# Patient Record
Sex: Female | Born: 2000
Health system: Southern US, Community
[De-identification: ages and names within clinical notes are randomized; demographics above are authoritative.]

## PROBLEM LIST (undated history)

## (undated) DIAGNOSIS — G43909 Migraine, unspecified, not intractable, without status migrainosus: Secondary | ICD-10-CM

## (undated) DIAGNOSIS — M0880 Other juvenile arthritis, unspecified site: Secondary | ICD-10-CM

## (undated) DIAGNOSIS — N926 Irregular menstruation, unspecified: Secondary | ICD-10-CM

## (undated) DIAGNOSIS — N83209 Unspecified ovarian cyst, unspecified side: Secondary | ICD-10-CM

## (undated) DIAGNOSIS — G43009 Migraine without aura, not intractable, without status migrainosus: Secondary | ICD-10-CM

## (undated) HISTORY — DX: Irregular menstruation, unspecified: N92.6

## (undated) HISTORY — DX: Other juvenile arthritis, unspecified site: M08.80

## (undated) HISTORY — DX: Unspecified ovarian cyst, unspecified side: N83.209

## (undated) HISTORY — DX: Migraine, unspecified, not intractable, without status migrainosus: G43.909

## (undated) HISTORY — PX: HIP ARTHROSCOPY W/ LABRAL REPAIR: SHX1750

## (undated) HISTORY — PX: WISDOM TOOTH EXTRACTION: SHX21

## (undated) HISTORY — DX: Migraine without aura, not intractable, without status migrainosus: G43.009

---
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
## 2022-03-08 ENCOUNTER — Ambulatory Visit (INDEPENDENT_AMBULATORY_CARE_PROVIDER_SITE_OTHER): Payer: 59 | Admitting: Adult Health

## 2022-03-08 ENCOUNTER — Other Ambulatory Visit: Payer: Self-pay

## 2022-03-08 ENCOUNTER — Encounter: Payer: Self-pay | Admitting: Adult Health

## 2022-03-08 VITALS — BP 114/68 | HR 104 | Temp 99.4°F | Ht 67.0 in | Wt 150.0 lb

## 2022-03-08 DIAGNOSIS — R002 Palpitations: Secondary | ICD-10-CM | POA: Diagnosis not present

## 2022-03-08 DIAGNOSIS — F419 Anxiety disorder, unspecified: Secondary | ICD-10-CM

## 2022-03-08 NOTE — Progress Notes (Signed)
[facility] [address] [place], [place] [address] Phone: [phone] Fax: [phone]   Office Visit Note  Patient Name: Denise Sheppard  Date of [id number]  Med Rec number [id number]  Date of Service: 03/08/2022  Bee venom, Amoxicillin-pot clavulanate, Delsym [dextromethorphan polistirex er], Dextromethorphan-guaifenesin, and Guaifenesin & derivatives  Chief Complaint  Patient presents with   Chest Pain    Palpitations at night      Chest Pain  Associated symptoms include palpitations. Pertinent negatives include no dizziness, fever, headaches, numbness or weakness.    Patient reports when she lays down at night to go to sleep, she feels like her heart is "pausing".  She also reports feeling like she needs to gasp for breath at times while laying down. She is in a single room this year.  She has noticed it a few times while sitting in class.  She describes it as happening once or twice, and then its normal.  She denies any dizziness, headache or nausea during episodes.   Current Medication:  Outpatient Encounter Medications as of 03/08/2022  Medication Sig   amitriptyline (ELAVIL) 10 MG tablet Take 37.5 mg by mouth at bedtime.   cetirizine HCl (ZYRTEC) 5 MG/5ML SYRP Take 5 mg by mouth daily.   magnesium citrate SOLN Take by mouth.   Pediatric Multiple Vit-C-FA (CHILDRENS CHEWABLE MULTI VITS PO) Take 1 tablet by mouth daily.   rizatriptan (MAXALT) 10 MG tablet Take 10 mg by mouth 2 (two) times daily as needed.   Ferrous Sulfate (IRON) 325 (65 Fe) MG TABS Take 1 capsule by mouth every 3 (three) days. (Patient not taking: Reported on 12/20/2020)   norgestimate-ethinyl estradiol (ORTHO-CYCLEN) 0.25-35 MG-MCG tablet Take 1 tablet by mouth daily. (Patient not taking: Reported on 03/08/2022)   Riboflavin 100 MG CAPS Take by mouth. (Patient not taking: Reported on 03/08/2022)   No facility-administered encounter medications on file as of 03/08/2022.      Medical  History: Past Medical History:  Diagnosis Date   Irregular periods    JIA (juvenile idiopathic arthritis) (HCC)    Migraine      Vital Signs: BP 114/68 (BP Location: Right Arm, Patient Position: Sitting)   Pulse (!) 104   Temp 99.4 F (37.4 C) (Tympanic)   Ht 5\' 7"  (1.702 m)   Wt 150 lb (68 kg)   SpO2 98%   BMI 23.49 kg/m    Review of Systems  Constitutional:  Negative for fatigue and fever.  HENT:  Negative for sinus pressure.   Cardiovascular:  Positive for chest pain and palpitations.  Neurological:  Negative for dizziness, syncope, weakness, light-headedness, numbness and headaches.    Physical Exam Constitutional:      Appearance: She is well-developed.  Cardiovascular:     Rate and Rhythm: Normal rate and regular rhythm.     Heart sounds: Normal heart sounds. Heart sounds not distant. No murmur heard. Neurological:     Mental Status: She is alert.  Psychiatric:        Mood and Affect: Mood normal. Mood is not anxious.        Behavior: Behavior normal.    Assessment/Plan: 1. Intermittent palpitations Episodes do not appear to be getting worse.  Discussed following up in clinic or with PCP for possible Holter monitor if symptoms worsen or continue. Encouraged good nutrition, and hydration.   2. Anxiety Likely anxiety related to school adjustment.  She is local from [place], and can see her PCP.  Discussed medication  like Hydroxyzine at bedtime, patient declined at this time and would like to wait and see if symptoms resolve as her comfort level being back at school improves. I agree with this plan, and encouraged patient to go ahead and make an appt with her PCP, that she could cancel if symptoms resolve.  She is agreeable.      General Counseling: eilidh marcano understanding of the findings of todays visit and agrees with plan of treatment. I have discussed any further diagnostic evaluation that may be needed or ordered today. We also reviewed her  medications today. she has been encouraged to call the office with any questions or concerns that should arise related to todays visit.   No orders of the defined types were placed in this encounter.   No orders of the defined types were placed in this encounter.   Time spent:20 Minutes including chart review.    Johnna Acosta AGNP-C Nurse Practitioner

## 2022-06-25 ENCOUNTER — Ambulatory Visit
Admission: EM | Admit: 2022-06-25 | Discharge: 2022-06-25 | Disposition: A | Discharge status: Home / Self Care | Payer: 59 | Attending: Emergency Medicine | Admitting: Emergency Medicine

## 2022-06-25 DIAGNOSIS — B349 Viral infection, unspecified: Secondary | ICD-10-CM | POA: Diagnosis not present

## 2022-06-25 DIAGNOSIS — Z1152 Encounter for screening for COVID-19: Secondary | ICD-10-CM | POA: Diagnosis not present

## 2022-06-25 LAB — RESP PANEL BY RT-PCR (FLU A&B, COVID) ARPGX2
Influenza A by PCR: NEGATIVE
Influenza B by PCR: NEGATIVE
SARS Coronavirus 2 by RT PCR: NEGATIVE

## 2022-06-25 NOTE — ED Provider Notes (Signed)
Denise Sheppard    CSN: [id number] Arrival date & time: 06/25/22  0901      History   Chief Complaint Chief Complaint  Patient presents with   Cough   Sore Throat    HPI Denise Sheppard is a 21 y.o. female.  Patient presents with congestion, cough, ear pain, headache since yesterday.  Negative at-home COVID test yesterday.  She denies fever, rash, sore throat, shortness of breath, vomiting, diarrhea, or other symptoms.  No OTC medications taken today.  Her medical history includes migraine headaches.  The history is provided by the patient and medical records.    Past Medical History:  Diagnosis Date   Irregular periods    JIA (juvenile idiopathic arthritis) (HCC)    Migraine     Patient Active Problem List   Diagnosis Date Noted   Apophysitis of fifth metatarsal, left 06/13/2016   Strain of calf muscle, initial encounter 05/03/2016    Past Surgical History:  Procedure Laterality Date   HIP ARTHROSCOPY W/ LABRAL REPAIR      OB History     Gravida  0   Para  0   Term  0   Preterm  0   AB  0   Living  0      SAB  0   IAB  0   Ectopic  0   Multiple  0   Live Births  0            Home Medications    Prior to Admission medications   Medication Sig Start Date End Date Taking? Authorizing Provider  amitriptyline (ELAVIL) 10 MG tablet Take 37.5 mg by mouth at bedtime. 09/30/19  Yes [provider]  cetirizine HCl (ZYRTEC) 5 MG/5ML SYRP Take 5 mg by mouth daily.   Yes [provider]  magnesium citrate SOLN Take by mouth.   Yes [provider]  Pediatric Multiple Vit-C-FA (CHILDRENS CHEWABLE MULTI VITS PO) Take 1 tablet by mouth daily.   Yes [provider]  Riboflavin 100 MG CAPS Take by mouth.   Yes [provider]  rizatriptan (MAXALT) 10 MG tablet Take 10 mg by mouth 2 (two) times daily as needed. 06/30/19  Yes [provider]  Ferrous Sulfate (IRON) 325 (65 Fe) MG TABS Take 1  capsule by mouth every 3 (three) days. Patient not taking: Reported on 12/20/2020    [provider]  norgestimate-ethinyl estradiol (ORTHO-CYCLEN) 0.25-35 MG-MCG tablet Take 1 tablet by mouth daily. Patient not taking: Reported on 03/08/2022 12/20/20   Doreene Burke, CNM    Family History Family History  Problem Relation Age of Onset   Ovarian cysts Mother    Cancer Maternal Grandmother    Diabetes Maternal Grandmother    Diabetes Maternal Grandfather    Stroke Paternal Grandmother    Diabetes Paternal Grandfather     Social History Social History   Tobacco Use   Smoking status: Never   Smokeless tobacco: Never  Vaping Use   Vaping Use: Never used  Substance Use Topics   Alcohol use: No   Drug use: No     Allergies   Bee venom, Amoxicillin-pot clavulanate, Delsym [dextromethorphan polistirex er], Dextromethorphan-guaifenesin, and Guaifenesin & derivatives   Review of Systems Review of Systems  Constitutional:  Negative for chills and fever.  HENT:  Positive for congestion, ear pain and sore throat.   Respiratory:  Positive for cough. Negative for shortness of breath.   Cardiovascular:  Negative for  chest pain and palpitations.  Gastrointestinal:  Negative for abdominal pain, diarrhea and vomiting.  Skin:  Negative for color change and rash.  Neurological:  Positive for headaches.  All other systems reviewed and are negative.    Physical Exam Triage Vital Signs ED Triage Vitals  Enc Vitals Group     BP 06/25/22 1002 119/76     Pulse Rate 06/25/22 1002 89     Resp 06/25/22 1002 16     Temp 06/25/22 1002 98.6 F (37 C)     Temp Source 06/25/22 1002 Oral     SpO2 06/25/22 1002 98 %     Weight --      Height --      Head Circumference --      Peak Flow --      Pain Score 06/25/22 0958 0     Pain Loc --      Pain Edu? --      Excl. in GC? --    No data found.  Updated Vital Signs BP 119/76 (BP Location: Right Arm)   Pulse 89   Temp 98.6 F (37  C) (Oral)   Resp 16   LMP 06/03/2022 (Exact Date)   SpO2 98%   Visual Acuity Right Eye Distance:   Left Eye Distance:   Bilateral Distance:    Right Eye Near:   Left Eye Near:    Bilateral Near:     Physical Exam Vitals and nursing note reviewed.  Constitutional:      General: She is not in acute distress.    Appearance: Normal appearance. She is well-developed. She is not ill-appearing.  HENT:     Right Ear: Tympanic membrane normal.     Left Ear: Tympanic membrane normal.     Nose: Nose normal.     Mouth/Throat:     Mouth: Mucous membranes are moist.     Pharynx: Oropharynx is clear.  Cardiovascular:     Rate and Rhythm: Normal rate and regular rhythm.     Heart sounds: Normal heart sounds.  Pulmonary:     Effort: Pulmonary effort is normal. No respiratory distress.     Breath sounds: Normal breath sounds.  Musculoskeletal:     Cervical back: Neck supple.  Skin:    General: Skin is warm and dry.  Neurological:     Mental Status: She is alert.  Psychiatric:        Mood and Affect: Mood normal.        Behavior: Behavior normal.      UC Treatments / Results  Labs (all labs ordered are listed, but only abnormal results are displayed) Labs Reviewed  RESP PANEL BY RT-PCR (FLU A&B, COVID) [id number]    EKG   Radiology No results found.  Procedures Procedures (including critical care time)  Medications Ordered in UC Medications - No data to display  Initial Impression / Assessment and Plan / UC Course  I have reviewed the triage vital signs and the nursing notes.  Pertinent labs & imaging results that were available during my care of the patient were reviewed by me and considered in my medical decision making (see chart for details).    Viral illness.  COVID and flu pending.  Discussed symptomatic treatment including Tylenol or ibuprofen, rest, hydration.  Instructed patient to follow up with PCP if symptoms are not improving.  She agrees to plan of  care.   Final Clinical Impressions(s) / UC Diagnoses   Final diagnoses:  Viral illness     Discharge Instructions      Your COVID and Flu tests are pending.    Take Tylenol or ibuprofen as needed for fever or discomfort.  Rest and keep yourself hydrated.    Follow-up with your primary care provider if your symptoms are not improving.         ED Prescriptions   None    PDMP not reviewed this encounter.   Mickie Bail, NP 06/25/22 1038

## 2022-06-25 NOTE — ED Triage Notes (Signed)
Pt presents with cough, mild congestion, bilateral ear pain, HA x 1 day.  Home COVID was negative.  No fevers. No meds this morning but took Advil cold and sinus last night with no relief.

## 2022-06-25 NOTE — Discharge Instructions (Addendum)
Your COVID and Flu tests are pending.    Take Tylenol or ibuprofen as needed for fever or discomfort.  Rest and keep yourself hydrated.    Follow-up with your primary care provider if your symptoms are not improving.     

## 2022-07-01 NOTE — Progress Notes (Signed)
    Carren Rang, PA-C   No chief complaint on file.   HPI:      [name] is a 21 y.o. G0P0000 whose LMP was Patient's last menstrual period was 06/03/2022 (exact date)., presents today for ***    Patient Active Problem List   Diagnosis Date Noted   Apophysitis of fifth metatarsal, left 06/13/2016   Strain of calf muscle, initial encounter 05/03/2016    Past Surgical History:  Procedure Laterality Date   HIP ARTHROSCOPY W/ LABRAL REPAIR      Family History  Problem Relation Age of Onset   Ovarian cysts Mother    Cancer Maternal Grandmother    Diabetes Maternal Grandmother    Diabetes Maternal Grandfather    Stroke Paternal Grandmother    Diabetes Paternal Grandfather     Social History   Socioeconomic History   Marital status: Single    Spouse name: Not on file   Number of children: Not on file   Years of education: Not on file   Highest education level: Not on file  Occupational History   Not on file  Tobacco Use   Smoking status: Never   Smokeless tobacco: Never  Vaping Use   Vaping Use: Never used  Substance and Sexual Activity   Alcohol use: No   Drug use: No   Sexual activity: Not Currently  Other Topics Concern   Not on file  Social History Narrative   Not on file   Social Determinants of Health   Financial Resource Strain: Not on file  Food Insecurity: Not on file  Transportation Needs: Not on file  Physical Activity: Not on file  Stress: Not on file  Social Connections: Not on file  Intimate Partner Violence: Not on file    Outpatient Medications Prior to Visit  Medication Sig Dispense Refill   amitriptyline (ELAVIL) 10 MG tablet Take 37.5 mg by mouth at bedtime.     cetirizine HCl (ZYRTEC) 5 MG/5ML SYRP Take 5 mg by mouth daily.     Ferrous Sulfate (IRON) 325 (65 Fe) MG TABS Take 1 capsule by mouth every 3 (three) days. (Patient not taking: Reported on 12/20/2020)     magnesium citrate SOLN Take by mouth.      norgestimate-ethinyl estradiol (ORTHO-CYCLEN) 0.25-35 MG-MCG tablet Take 1 tablet by mouth daily. (Patient not taking: Reported on 03/08/2022) 28 tablet 11   Pediatric Multiple Vit-C-FA (CHILDRENS CHEWABLE MULTI VITS PO) Take 1 tablet by mouth daily.     Riboflavin 100 MG CAPS Take by mouth.     rizatriptan (MAXALT) 10 MG tablet Take 10 mg by mouth 2 (two) times daily as needed.     No facility-administered medications prior to visit.      ROS:  Review of Systems BREAST: No symptoms   OBJECTIVE:   Vitals:  LMP 06/03/2022 (Exact Date)   Physical Exam  Results: No results found for this or any previous visit (from the past 24 hour(s)).   Assessment/Plan: No diagnosis found.    No orders of the defined types were placed in this encounter.     No follow-ups on file.  Caidyn Henricksen B. Dominique Calvey, PA-C 07/01/2022 7:40 PM

## 2022-07-02 ENCOUNTER — Other Ambulatory Visit (INDEPENDENT_AMBULATORY_CARE_PROVIDER_SITE_OTHER): Payer: 59

## 2022-07-02 ENCOUNTER — Ambulatory Visit: Payer: 59 | Admitting: Obstetrics and Gynecology

## 2022-07-02 ENCOUNTER — Encounter: Payer: Self-pay | Admitting: Obstetrics and Gynecology

## 2022-07-02 VITALS — BP 110/70 | Ht 67.0 in | Wt 153.0 lb

## 2022-07-02 DIAGNOSIS — N83201 Unspecified ovarian cyst, right side: Secondary | ICD-10-CM

## 2022-07-02 DIAGNOSIS — R1031 Right lower quadrant pain: Secondary | ICD-10-CM

## 2022-07-02 DIAGNOSIS — Z113 Encounter for screening for infections with a predominantly sexual mode of transmission: Secondary | ICD-10-CM

## 2022-07-02 DIAGNOSIS — R102 Pelvic and perineal pain: Secondary | ICD-10-CM

## 2022-07-02 NOTE — Patient Instructions (Signed)
I value your feedback and you entrusting us with your care. If you get a [facility] patient survey, I would appreciate you taking the time to let us know about your experience today. Thank you! ? ? ?

## 2022-07-22 IMAGING — US US SOFT TISSUE HEAD/NECK
1 series · 10 of 10 positions shown · non-contrast
Comparison: None.

CLINICAL DATA: Skin cyst left face

EXAM:
ULTRASOUND OF HEAD/NECK SOFT TISSUES
TECHNIQUE: Ultrasound examination of the head and neck soft tissues was
performed in the area of clinical concern.

[Series 1: us soft tissue head & neck (non-thyroid) · 10 acquisitions, 10 frames shown]
[im 1/10]
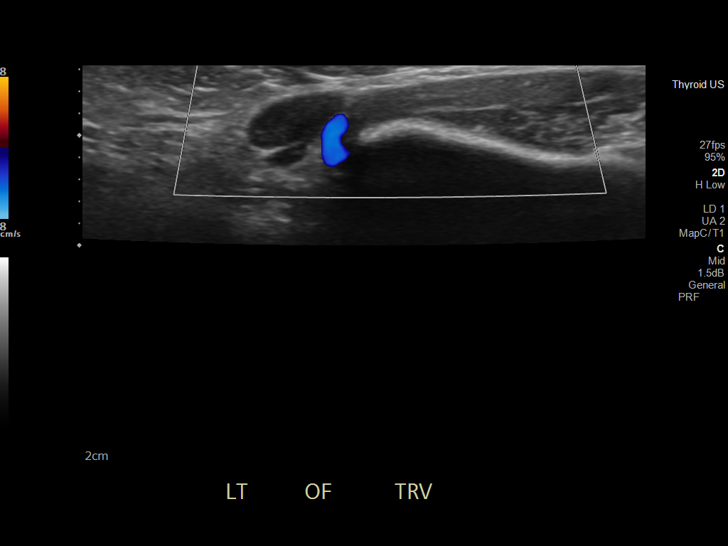
[im 2/10]
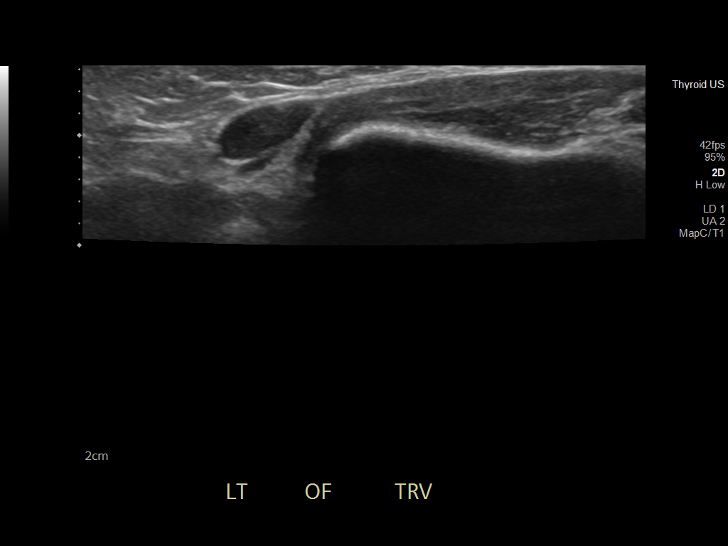
[im 3/10]
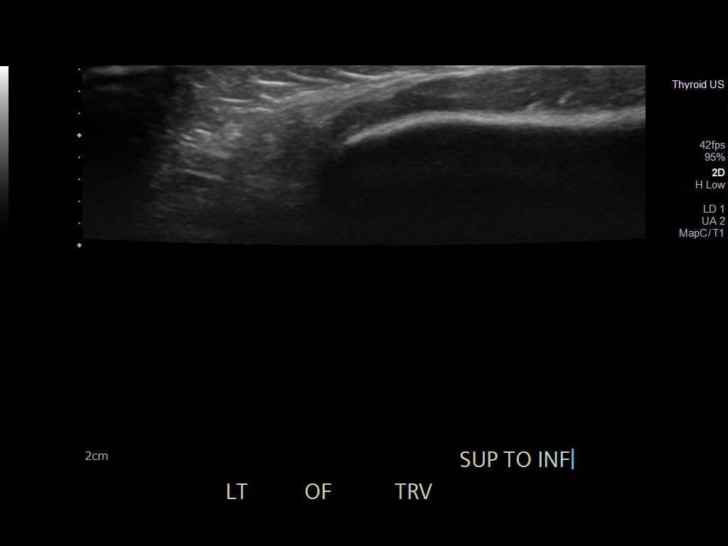
[im 4/10]
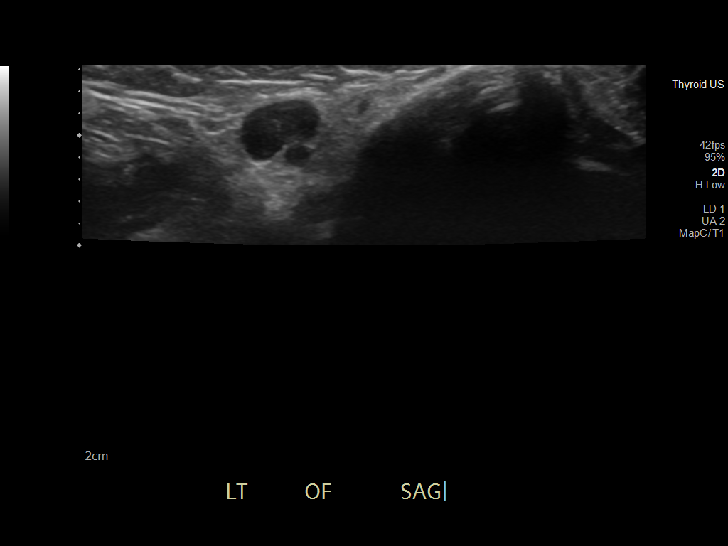
[im 5/10]
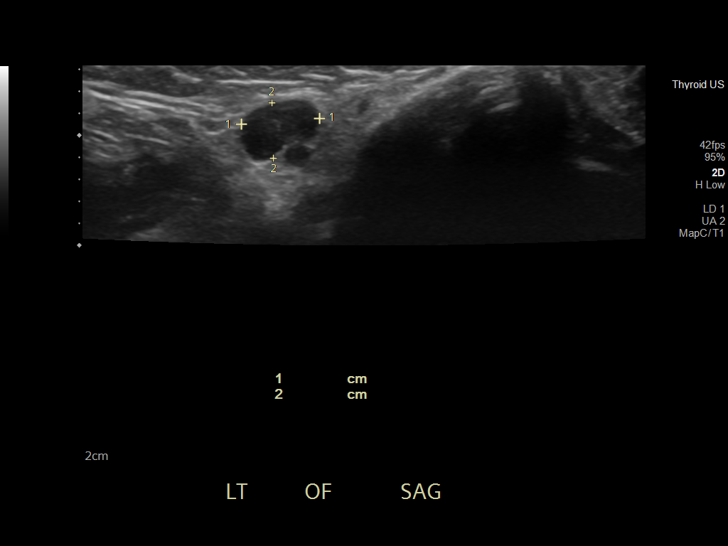
[im 6/10]
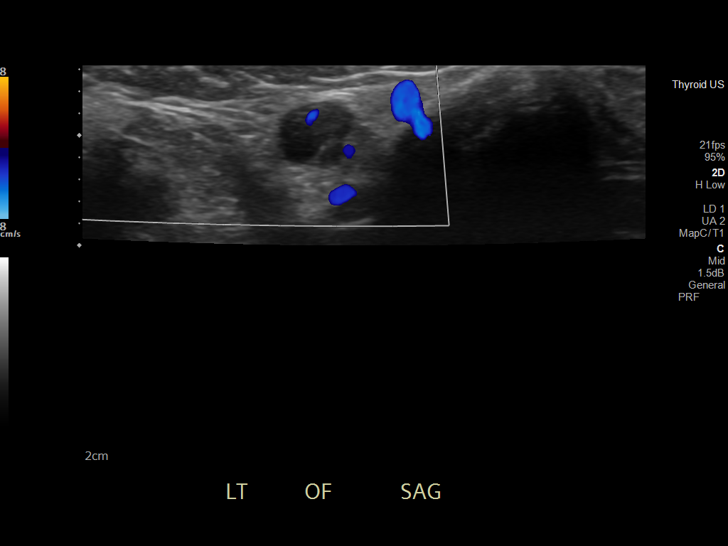
[im 7/10]
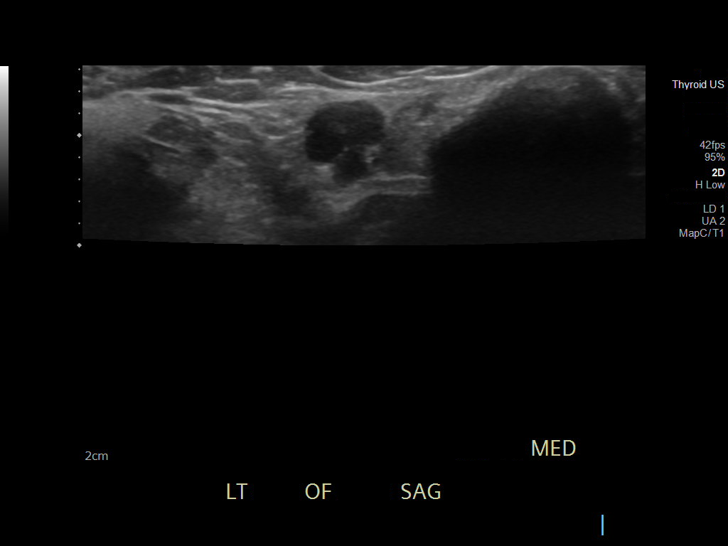
[im 8/10]
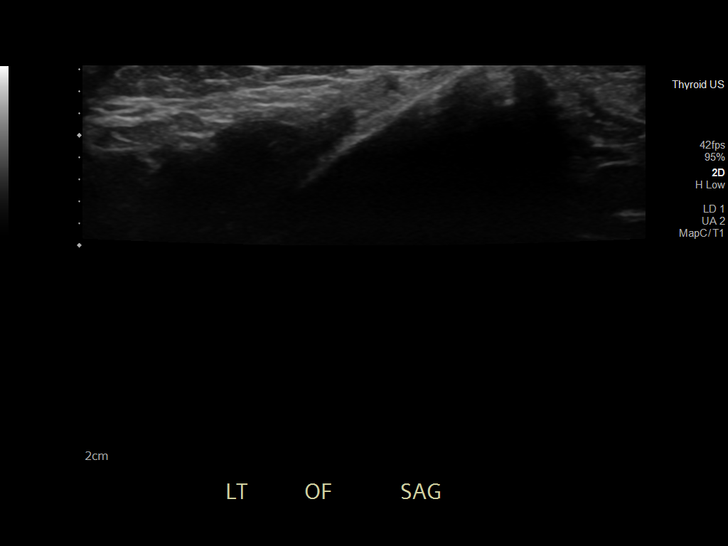
[im 9/10]
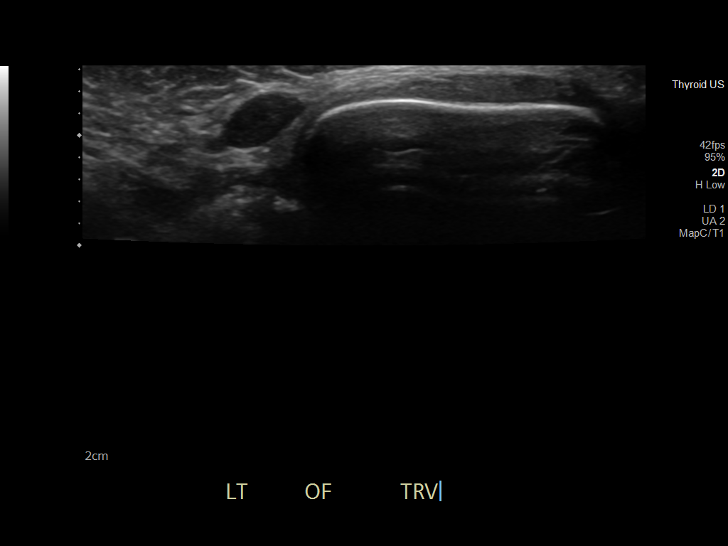
[im 10/10]
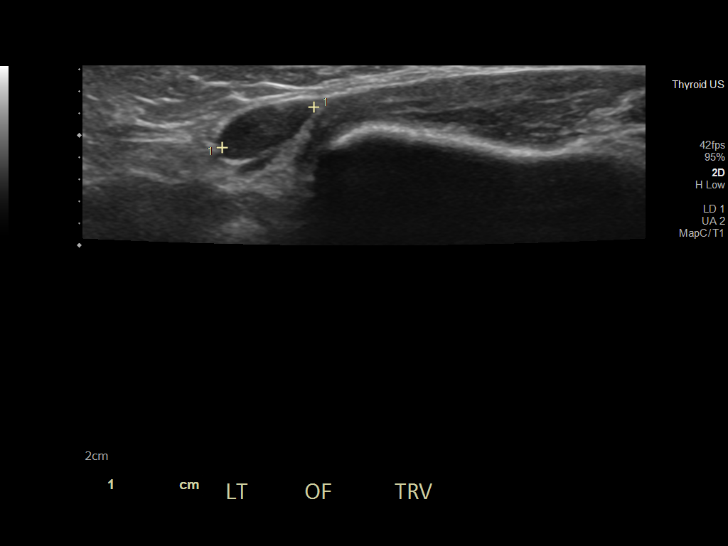

[10 of 10 positions shown; findings below may reference images not displayed]

FINDINGS: Palpable abnormality in the left face was identified by ultrasound.
This corresponds to a hypoechoic nodule in the subcutaneous tissues
measuring 9 x 7 x 5 mm. Internal blood flow indicates solid tissue.
IMPRESSION: Solid hypoechoic subcutaneous nodule left face likely represents a
lymph node.

## 2022-09-24 ENCOUNTER — Ambulatory Visit
Admission: RE | Admit: 2022-09-24 | Discharge: 2022-09-24 | Disposition: A | Discharge status: Home / Self Care | Payer: 59 | Source: Ambulatory Visit | Attending: Obstetrics and Gynecology | Admitting: Obstetrics and Gynecology

## 2022-09-24 ENCOUNTER — Other Ambulatory Visit: Payer: 59

## 2022-09-24 ENCOUNTER — Encounter: Payer: Self-pay | Admitting: Obstetrics and Gynecology

## 2022-09-24 DIAGNOSIS — N83201 Unspecified ovarian cyst, right side: Secondary | ICD-10-CM | POA: Insufficient documentation

## 2022-09-24 NOTE — Progress Notes (Unsigned)
Wayland Denis, PA-C   No chief complaint on file.   HPI:      Ms. KAELAN BRANDLI is a 22 y.o. G0P0000 whose LMP was No LMP recorded., presents today for ***  Was on OCPs for many yrs due to hx of menorrhagia with irregular cycles; stopped OCPs 3/23. Menses were monthly initially, lasting 7 days, mod flow, no BTB, no dysmen. Menses now closer to Q6 wks past 2 cycles, lasting 10 days, mod flow, no BTB, no dysmen.  Pt never sexually active.  Hx of RTO cyst, resolved on u/s 3/24  Patient Active Problem List   Diagnosis Date Noted   Right ovarian cyst 07/02/2022   Apophysitis of fifth metatarsal, left 06/13/2016   Strain of calf muscle, initial encounter 05/03/2016    Past Surgical History:  Procedure Laterality Date   HIP ARTHROSCOPY W/ LABRAL REPAIR     WISDOM TOOTH EXTRACTION      Family History  Problem Relation Age of Onset   Ovarian cysts Mother    Breast cancer Maternal Aunt        65s   Lung cancer Maternal Uncle    Prostate cancer Maternal Uncle    Diabetes Maternal Grandmother    Lung cancer Maternal Grandmother    Diabetes Maternal Grandfather    Stroke Paternal Grandmother    Brain cancer Paternal Grandmother    Diabetes Paternal Grandfather    Breast cancer Maternal Great-grandmother        twice 34/80s    Social History   Socioeconomic History   Marital status: Single    Spouse name: Not on file   Number of children: Not on file   Years of education: Not on file   Highest education level: Not on file  Occupational History   Not on file  Tobacco Use   Smoking status: Never   Smokeless tobacco: Never  Vaping Use   Vaping Use: Never used  Substance and Sexual Activity   Alcohol use: No   Drug use: No   Sexual activity: Never    Birth control/protection: None  Other Topics Concern   Not on file  Social History Narrative   Not on file   Social Determinants of Health   Financial Resource Strain: Not on file  Food Insecurity: Not on  file  Transportation Needs: Not on file  Physical Activity: Not on file  Stress: Not on file  Social Connections: Not on file  Intimate Partner Violence: Not on file    Outpatient Medications Prior to Visit  Medication Sig Dispense Refill   amitriptyline (ELAVIL) 25 MG tablet Take 50 mg by mouth at bedtime.     cetirizine HCl (ZYRTEC) 5 MG/5ML SYRP Take 5 mg by mouth daily.     magnesium citrate SOLN Take by mouth.     Pediatric Multiple Vit-C-FA (CHILDRENS CHEWABLE MULTI VITS PO) Take 1 tablet by mouth daily.     Riboflavin 100 MG CAPS Take by mouth.     rizatriptan (MAXALT) 10 MG tablet Take 10 mg by mouth 2 (two) times daily as needed.     No facility-administered medications prior to visit.      ROS:  Review of Systems BREAST: No symptoms   OBJECTIVE:   Vitals:  There were no vitals taken for this visit.  Physical Exam  Results: No results found for this or any previous visit (from the past 24 hour(s)).   Assessment/Plan: No diagnosis found.    No orders  of the defined types were placed in this encounter.     No follow-ups on file.  Chiquitta Matty B. Kalista Laguardia, PA-C 09/24/2022 5:06 PM

## 2022-09-25 ENCOUNTER — Encounter: Payer: Self-pay | Admitting: Obstetrics and Gynecology

## 2022-09-25 ENCOUNTER — Ambulatory Visit: Payer: 59 | Admitting: Obstetrics and Gynecology

## 2022-09-25 VITALS — BP 100/68 | Ht 67.0 in | Wt 153.0 lb

## 2022-09-25 DIAGNOSIS — Z30011 Encounter for initial prescription of contraceptive pills: Secondary | ICD-10-CM | POA: Diagnosis not present

## 2022-09-25 MED ORDER — MICROGESTIN 24 FE 1-20 MG-MCG PO TABS: 1.0000 | ORAL_TABLET | Freq: Every day | ORAL | 3 refills | Status: DC

## 2022-10-10 ENCOUNTER — Encounter: Payer: Self-pay | Admitting: Obstetrics and Gynecology

## 2023-01-28 ENCOUNTER — Encounter: Payer: Self-pay | Admitting: Obstetrics and Gynecology

## 2023-03-06 ENCOUNTER — Encounter: Payer: Self-pay | Admitting: Obstetrics and Gynecology

## 2023-03-06 NOTE — Telephone Encounter (Signed)
Pt aware you are out of the office 

## 2023-03-07 MED ORDER — DROSPIRENONE-ETHINYL ESTRADIOL 3-0.02 MG PO TABS: 1.0000 | ORAL_TABLET | Freq: Every day | ORAL | 1 refills | Status: DC

## 2023-08-05 ENCOUNTER — Other Ambulatory Visit: Payer: Self-pay

## 2023-08-05 DIAGNOSIS — E860 Dehydration: Secondary | ICD-10-CM | POA: Insufficient documentation

## 2023-08-05 DIAGNOSIS — R1084 Generalized abdominal pain: Secondary | ICD-10-CM | POA: Insufficient documentation

## 2023-08-05 DIAGNOSIS — R112 Nausea with vomiting, unspecified: Secondary | ICD-10-CM | POA: Insufficient documentation

## 2023-08-05 DIAGNOSIS — D72829 Elevated white blood cell count, unspecified: Secondary | ICD-10-CM | POA: Insufficient documentation

## 2023-08-05 DIAGNOSIS — R197 Diarrhea, unspecified: Secondary | ICD-10-CM | POA: Diagnosis not present

## 2023-08-05 LAB — COMPREHENSIVE METABOLIC PANEL
ALT: 12 U/L (ref 0–44)
AST: 23 U/L (ref 15–41)
Albumin: 4.5 g/dL (ref 3.5–5.0)
Alkaline Phosphatase: 78 U/L (ref 38–126)
Anion gap: 16 — ABNORMAL HIGH (ref 5–15)
BUN: 20 mg/dL (ref 6–20)
CO2: 20 mmol/L — ABNORMAL LOW (ref 22–32)
Calcium: 9.7 mg/dL (ref 8.9–10.3)
Chloride: 99 mmol/L (ref 98–111)
Creatinine, Ser: 0.73 mg/dL (ref 0.44–1.00)
GFR, Estimated: >60 mL/min (ref >60)
Glucose, Bld: 148 mg/dL — ABNORMAL HIGH (ref 70–99)
Potassium: 4 mmol/L (ref 3.5–5.1)
Sodium: 135 mmol/L (ref 135–145)
Total Bilirubin: 1.8 mg/dL — ABNORMAL HIGH (ref 0.0–1.2)
Total Protein: 8.4 g/dL — ABNORMAL HIGH (ref 6.5–8.1)

## 2023-08-05 LAB — LIPASE, BLOOD: Lipase: 23 U/L (ref 11–51)

## 2023-08-05 LAB — URINALYSIS, ROUTINE W REFLEX MICROSCOPIC
Bacteria, UA: NONE SEEN
Bilirubin Urine: NEGATIVE
Glucose, UA: NEGATIVE mg/dL
Hgb urine dipstick: NEGATIVE
Ketones, ur: 20 mg/dL — AB
Leukocytes,Ua: NEGATIVE
Nitrite: NEGATIVE
Protein, ur: 100 mg/dL — AB
Specific Gravity, Urine: 1.029 (ref 1.005–1.030)
pH: 5 (ref 5.0–8.0)

## 2023-08-05 LAB — CBC WITH DIFFERENTIAL/PLATELET
Abs Immature Granulocytes: 0.03 10*3/uL (ref 0.00–0.07)
Basophils Absolute: 0.1 10*3/uL (ref 0.0–0.1)
Basophils Relative: 0 %
Eosinophils Absolute: 0 10*3/uL (ref 0.0–0.5)
Eosinophils Relative: 0 %
HCT: 47.6 % — ABNORMAL HIGH (ref 36.0–46.0)
Hemoglobin: 16.1 g/dL — ABNORMAL HIGH (ref 12.0–15.0)
Immature Granulocytes: 0 %
Lymphocytes Relative: 4 %
Lymphs Abs: 0.6 10*3/uL — ABNORMAL LOW (ref 0.7–4.0)
MCH: 29.3 pg (ref 26.0–34.0)
MCHC: 33.8 g/dL (ref 30.0–36.0)
MCV: 86.7 fL (ref 80.0–100.0)
Monocytes Absolute: 0.4 10*3/uL (ref 0.1–1.0)
Monocytes Relative: 3 %
Neutro Abs: 12.3 10*3/uL — ABNORMAL HIGH (ref 1.7–7.7)
Neutrophils Relative %: 93 %
Platelets: 364 10*3/uL (ref 150–400)
RBC: 5.49 MIL/uL — ABNORMAL HIGH (ref 3.87–5.11)
RDW: 11.5 % (ref 11.5–15.5)
WBC: 13.4 10*3/uL — ABNORMAL HIGH (ref 4.0–10.5)
nRBC: 0 % (ref 0.0–0.2)

## 2023-08-05 MED ORDER — ONDANSETRON 4 MG PO TBDP
4.0000 mg | ORAL_TABLET | Freq: Once | ORAL | Status: AC | PRN
  Administered 2023-08-05: 4 mg via ORAL
  Filled 2023-08-05: qty 1

## 2023-08-05 NOTE — ED Triage Notes (Signed)
Pt arrives via POV with CC of abdominal pain with associated vomiting and diarrhea since 2pm today. Pt reports bright red blood in stool. Pt does appear flushed but is in NAD at this time.

## 2023-08-06 ENCOUNTER — Emergency Department
Admission: EM | Admit: 2023-08-06 | Discharge: 2023-08-06 | Disposition: A | Discharge status: Home / Self Care | Payer: 59 | Attending: Emergency Medicine | Admitting: Emergency Medicine

## 2023-08-06 ENCOUNTER — Emergency Department: Payer: 59

## 2023-08-06 DIAGNOSIS — R1084 Generalized abdominal pain: Secondary | ICD-10-CM

## 2023-08-06 DIAGNOSIS — R112 Nausea with vomiting, unspecified: Secondary | ICD-10-CM

## 2023-08-06 DIAGNOSIS — E860 Dehydration: Secondary | ICD-10-CM

## 2023-08-06 LAB — PREGNANCY, URINE: Preg Test, Ur: NEGATIVE

## 2023-08-06 LAB — SAMPLE TO BLOOD BANK

## 2023-08-06 MED ORDER — SODIUM CHLORIDE 0.9 % IV BOLUS
1000.0000 mL | Freq: Once | INTRAVENOUS | Status: AC
  Administered 2023-08-06: 1000 mL via INTRAVENOUS

## 2023-08-06 MED ORDER — ONDANSETRON 4 MG PO TBDP: 4.0000 mg | ORAL_TABLET | Freq: Three times a day (TID) | ORAL | 0 refills | Status: DC | PRN

## 2023-08-06 MED ORDER — MORPHINE SULFATE (PF) 2 MG/ML IV SOLN
2.0000 mg | Freq: Once | INTRAVENOUS | Status: AC
  Administered 2023-08-06: 2 mg via INTRAVENOUS
  Filled 2023-08-06: qty 1

## 2023-08-06 MED ORDER — ONDANSETRON HCL 4 MG/2ML IJ SOLN
4.0000 mg | Freq: Once | INTRAMUSCULAR | Status: AC
  Administered 2023-08-06: 4 mg via INTRAVENOUS
  Filled 2023-08-06: qty 2

## 2023-08-06 MED ORDER — IOHEXOL 300 MG/ML  SOLN
100.0000 mL | Freq: Once | INTRAMUSCULAR | Status: AC | PRN
  Administered 2023-08-06: 100 mL via INTRAVENOUS

## 2023-08-06 NOTE — Discharge Instructions (Signed)
You may take Zofran as needed for nausea/vomiting.  Clear liquids x 12 hours, then bland diet x 3 days, then slowly advance diet as tolerated.  Return to the ER for worsening symptoms, persistent vomiting, difficulty breathing or other concerns. 

## 2023-08-06 NOTE — ED Provider Notes (Signed)
[facility] Provider Note    Event Date/Time   First MD Initiated Contact with Patient 08/06/23 0024     (approximate)   History   Emesis and Diarrhea   HPI  Denise Sheppard is a 23 y.o. female  [facility] with a chief complaint of abdominal pain, nausea, vomiting and diarrhea since 2pm. Denies associated fever/chills, chest pain, shortness of breath. Reports BRBPR after several episodes of diarrhea.       Past Medical History   Past Medical History:  Diagnosis Date   Irregular periods    JIA (juvenile idiopathic arthritis) (HCC)    Migraine without aura      Active Problem List   Patient Active Problem List   Diagnosis Date Noted   Right ovarian cyst 07/02/2022   Apophysitis of fifth metatarsal, left 06/13/2016   Strain of calf muscle, initial encounter 05/03/2016     Past Surgical History   Past Surgical History:  Procedure Laterality Date   HIP ARTHROSCOPY W/ LABRAL REPAIR     WISDOM TOOTH EXTRACTION       Home Medications   Prior to Admission medications   Medication Sig Start Date End Date Taking? Authorizing Provider  amitriptyline (ELAVIL) 25 MG tablet Take 50 mg by mouth at bedtime.    [provider]  cetirizine HCl (ZYRTEC) 5 MG/5ML SYRP Take 5 mg by mouth daily.    [provider]  drospirenone-ethinyl estradiol (YAZ) 3-0.02 MG tablet Take 1 tablet by mouth daily. 03/07/23   Copland, Alicia B, PA-C  magnesium citrate SOLN Take by mouth.    [provider]  Pediatric Multiple Vit-C-FA (CHILDRENS CHEWABLE MULTI VITS PO) Take 1 tablet by mouth daily.    [provider]  Riboflavin 100 MG CAPS Take by mouth.    [provider]  rizatriptan (MAXALT) 10 MG tablet Take 10 mg by mouth 2 (two) times daily as needed. 06/30/19   [provider]     Allergies  Bee venom, Amoxicillin-pot clavulanate, Delsym [dextromethorphan polistirex  er], Dextromethorphan, Dextromethorphan-guaifenesin, and Guaifenesin & derivatives   Family History   Family History  Problem Relation Age of Onset   Ovarian cysts Mother    Breast cancer Maternal Aunt        50s   Lung cancer Maternal Uncle    Prostate cancer Maternal Uncle    Diabetes Maternal Grandmother    Lung cancer Maternal Grandmother    Diabetes Maternal Grandfather    Stroke Paternal Grandmother    Brain cancer Paternal Grandmother    Diabetes Paternal Grandfather    Breast cancer Maternal Great-grandmother        twice 70/80s     Physical Exam  Triage Vital Signs: ED Triage Vitals  Encounter Vitals Group     BP 08/05/23 2015 116/81     Systolic BP Percentile --      Diastolic BP Percentile --      Pulse Rate 08/05/23 2015 (!) 119     Resp 08/05/23 2014 20     Temp 08/05/23 2018 (!) 96.4 F (35.8 C)     Temp Source 08/05/23 2014 Oral     SpO2 08/05/23 2014 99 %     Weight 08/05/23 2017 143 lb (64.9 kg)     Height 08/05/23 2017 5\' 7"  (1.702 m)     Head Circumference --      Peak Flow --  Pain Score 08/05/23 2014 8     Pain Loc --      Pain Education --      Exclude from Growth Chart --     Updated Vital Signs: BP 116/81   Pulse (!) 119   Temp (!) 96.4 F (35.8 C) (Axillary)   Resp 20   Ht 5\' 7"  (1.702 m)   Wt 64.9 kg   LMP 07/23/2023 (Approximate)   SpO2 99%   BMI 22.40 kg/m    General: Awake, mild distress.  CV:  RRR. Good peripheral perfusion.  Resp:  Normal effort. CTAB. Abd:  Mild diffuse tenderness to palpation without rebound or guarding. No distention.  No truncal vesicles. Other:  Mildly dry mucus membranes.   ED Results / Procedures / Treatments  Labs (all labs ordered are listed, but only abnormal results are displayed) Labs Reviewed  COMPREHENSIVE METABOLIC PANEL - Abnormal; Notable for the following components:      Result Value   CO2 20 (*)    Glucose, Bld 148 (*)    Total Protein 8.4 (*)    Total Bilirubin 1.8 (*)     Anion gap 16 (*)    All other components within normal limits  URINALYSIS, ROUTINE W REFLEX MICROSCOPIC - Abnormal; Notable for the following components:   Color, Urine AMBER (*)    APPearance HAZY (*)    Ketones, ur 20 (*)    Protein, ur 100 (*)    All other components within normal limits  CBC WITH DIFFERENTIAL/PLATELET - Abnormal; Notable for the following components:   WBC 13.4 (*)    RBC 5.49 (*)    Hemoglobin 16.1 (*)    HCT 47.6 (*)    Neutro Abs 12.3 (*)    Lymphs Abs 0.6 (*)    All other components within normal limits  LIPASE, BLOOD  PREGNANCY, URINE  SAMPLE TO BLOOD BANK     EKG  None   RADIOLOGY I have independently visualized and interpreted patient's imaging study as well as noted the radiology interpretation:  CT abd/pel: normal appendix, enterocolitis  Official radiology report(s): CT ABDOMEN PELVIS W CONTRAST Result Date: 08/06/2023 CLINICAL DATA:  Abdominal pain, vomiting and diarrhea. EXAM: CT ABDOMEN AND PELVIS WITH CONTRAST TECHNIQUE: Multidetector CT imaging of the abdomen and pelvis was performed using the standard protocol following bolus administration of intravenous contrast. RADIATION DOSE REDUCTION: This exam was performed according to the departmental dose-optimization program which includes automated exposure control, adjustment of the mA and/or kV according to patient size and/or use of iterative reconstruction technique. CONTRAST:  OMNIPAQUE IOHEXOL 300 MG/ML  SOLN COMPARISON:  None Available. FINDINGS: Lower chest: No abnormality. Hepatobiliary: No focal liver abnormality is seen. No calcified gallstones, gallbladder wall thickening, or biliary dilatation. Pancreas: No abnormality Spleen: No abnormality.  No splenomegaly. Adrenals/Urinary Tract: Adrenal glands are unremarkable. Kidneys are normal, without renal calculi, focal lesion, or hydronephrosis. Bladder is unremarkable. Stomach/Bowel: Slight gastric distention with air and fluid.  There is no wall thickening. There is nondilated fluid filling of the small bowel, some segments showing mucosal enhancement consistent with a nonspecific enteritis. There is a normal caliber appendix best seen on the coronal reformats. Fluid filling continues into the colon to the mid descending segment but there is no colonic thickening or inflammatory change, additional fluid slightly distends the sigmoid segment. Findings consistent with a diarrheal enterocolitis but there is no mesenteric inflammatory change or overt wall thickening. Laxatives could produce a similar picture. Vascular/Lymphatic: No significant  vascular findings are present. No enlarged abdominal or pelvic lymph nodes. Reproductive: Uterus and bilateral adnexa are unremarkable. Other: No abdominal wall hernia or abnormality. No abdominopelvic ascites. Musculoskeletal: No acute or significant osseous findings regional skeletal fractures. IMPRESSION: 1. Fluid filling of the small bowel and colon consistent with a diarrheal enterocolitis. No mesenteric inflammatory change or overt wall thickening. 2. No evidence of bowel obstruction or inflammation. 3. Normal caliber appendix. Electronically Signed   By: Almira Bar M.D.   On: 08/06/2023 03:01     PROCEDURES:  Critical Care performed: No  Procedures   MEDICATIONS ORDERED IN ED: Medications  ondansetron (ZOFRAN-ODT) disintegrating tablet 4 mg (4 mg Oral Given 08/05/23 2024)  sodium chloride 0.9 % bolus 1,000 mL (1,000 mLs Intravenous New Bag/Given 08/06/23 0155)  ondansetron (ZOFRAN) injection 4 mg (4 mg Intravenous Given 08/06/23 0153)  morphine (PF) 2 MG/ML injection 2 mg (2 mg Intravenous Given 08/06/23 0157)  iohexol (OMNIPAQUE) 300 MG/ML solution 100 mL (100 mLs Intravenous Contrast Given 08/06/23 0237)     IMPRESSION / MDM / ASSESSMENT AND PLAN / ED COURSE  I reviewed the triage vital signs and the nursing notes.                             23 year old female presenting  with abdominal pain, nausea, vomiting and diarrhea. Differential diagnosis includes, but is not limited to, ovarian cyst, ovarian torsion, acute appendicitis, diverticulitis, urinary tract infection/pyelonephritis, endometriosis, bowel obstruction, colitis, renal colic, gastroenteritis, hernia, fibroids, endometriosis, pregnancy related pain including ectopic pregnancy, etc. I have personally reviewed patient's records and note a PCP office visit for annual physical exam on 03/06/2023.  Patient's presentation is most consistent with acute complicated illness / injury requiring diagnostic workup.  Laboratory results demonstrate mild leukocytosis with WBC 13.4, mildly elevated tbili likely secondary to vomiting, minimally elevated AG likely secondary to dehydration, ketonuria. Will initiate IV fluid resuscitation, IV Zofran for nausea, IV Morphine for pain and obtain CT abd/pel to evaluate etiology of patient's symptoms.  Clinical Course as of 08/06/23 0403  Tue Aug 06, 2023  0401 Updated patient and father on CT results. She has taken PO without emesis. Will discharge home with as needed Zofran and she will follow up with her PCP closely. Strict return precautions given. Both verbalize understanding and agree with plan of care. [JS]    Clinical Course User Index [JS] Irean Hong, MD     FINAL CLINICAL IMPRESSION(S) / ED DIAGNOSES   Final diagnoses:  Nausea vomiting and diarrhea  Dehydration  Generalized abdominal pain     Rx / DC Orders   ED Discharge Orders     None        Note:  This document was prepared using Dragon voice recognition software and may include unintentional dictation errors.   Irean Hong, MD 08/06/23 [phone]

## 2023-08-14 ENCOUNTER — Telehealth: Payer: Self-pay

## 2023-08-14 DIAGNOSIS — Z3041 Encounter for surveillance of contraceptive pills: Secondary | ICD-10-CM

## 2023-08-14 MED ORDER — DROSPIRENONE-ETHINYL ESTRADIOL 3-0.02 MG PO TABS: 1.0000 | ORAL_TABLET | Freq: Every day | ORAL | 0 refills | Status: DC

## 2023-08-14 NOTE — Telephone Encounter (Signed)
[facility] VOICEMAIL: Patient states she has scheduled her annual for 09/03/23. Requesting refill of drospirenone-ethinyl estradiol (YAZ) 3-0.02 MG tablet .

## 2023-08-14 NOTE — Telephone Encounter (Signed)
1 pack sent to [facility]. Patient aware.

## 2023-09-02 NOTE — Progress Notes (Deleted)
 PCP:  Carren Rang, PA-C   No chief complaint on file.    HPI:      [name] is a 23 y.o. G0P0000 whose LMP was Patient's last menstrual period was 07/23/2023 (approximate)., presents today for her annual examination.  Her menses are {norm/abn:715}, lasting {number: 22536} days.  Dysmenorrhea {dysmen:716}. She {does:18564} have intermenstrual bleeding.Would like to go back on OCPs for cycles and ovar cyst prevention. Was on sprintec and Loestrin in past for hx of menorrhagia with irregular cycles; stopped sprintec 3/23. Menses were monthly initially, lasting 7 days, mod flow, no BTB, no dysmen. Menses now closer to Q6 wks past several cycles, lasting 10 days, mod flow, no BTB, no dysmen. Hx of chronic migraines without aura. Wants low dose OCP.  Pt never sexually active.  Hx of RTO cyst 12/23, resolved on u/s 3/24. Pt had sever pain a few wks ago and thinks RTO cyst rupture.  Sex activity: single partner, contraception - OCP (estrogen/progesterone).  Last Pap: N/A due to age Hx of STDs: {STD hx:14358}  There is no FH of breast cancer. There is no FH of ovarian cancer. The patient {does:18564} do self-breast exams.  Tobacco use: {tob:20664} Alcohol use: {Alcohol:11675} No drug use.  Exercise: {exercise:31265}  She {does:18564} get adequate calcium and Vitamin D in her diet.  Patient Active Problem List   Diagnosis Date Noted   Right ovarian cyst 07/02/2022   Apophysitis of fifth metatarsal, left 06/13/2016   Strain of calf muscle, initial encounter 05/03/2016    Past Surgical History:  Procedure Laterality Date   HIP ARTHROSCOPY W/ LABRAL REPAIR     WISDOM TOOTH EXTRACTION      Family History  Problem Relation Age of Onset   Ovarian cysts Mother    Breast cancer Maternal Aunt        57s   Lung cancer Maternal Uncle    Prostate cancer Maternal Uncle    Diabetes Maternal Grandmother    Lung cancer Maternal Grandmother    Diabetes Maternal Grandfather     Stroke Paternal Grandmother    Brain cancer Paternal Grandmother    Diabetes Paternal Grandfather    Breast cancer Maternal Great-grandmother        twice 70/80s    Social History   Socioeconomic History   Marital status: Single    Spouse name: Not on file   Number of children: Not on file   Years of education: Not on file   Highest education level: Not on file  Occupational History   Not on file  Tobacco Use   Smoking status: Never   Smokeless tobacco: Never  Vaping Use   Vaping status: Never Used  Substance and Sexual Activity   Alcohol use: No   Drug use: No   Sexual activity: Never    Birth control/protection: None  Other Topics Concern   Not on file  Social History Narrative   Not on file   Social Drivers of Health   Financial Resource Strain: Low Risk  (08/12/2023)   Received from [facility]   Overall Financial Resource Strain (CARDIA)    Difficulty of Paying Living Expenses: Not hard at all  Food Insecurity: No Food Insecurity (08/12/2023)   Received from [facility]   Hunger Vital Sign    Worried About Running Out of Food in the Last Year: Never true    Ran Out of Food in the Last Year: Never true  Transportation Needs:  No Transportation Needs (08/12/2023)   Received from [facility] - Transportation    In the past 12 months, has lack of transportation kept you from medical appointments or from getting medications?: No    Lack of Transportation (Non-Medical): No  Physical Activity: Inactive (06/24/2018)   Received from [facility], [facility]   Exercise Vital Sign    Days of Exercise per Week: 0 days    Minutes of Exercise per Session: 0 min  Stress: No Stress Concern Present (06/24/2018)   Received from [facility], [facility]   [employer] of Occupational Health - Occupational Stress Questionnaire    Feeling of  Stress : Not at all  Social Connections: Unknown (06/24/2018)   Received from [facility], [facility]   Social Connection and Isolation Panel [NHANES]    Frequency of Communication with Friends and Family: Patient declined    Frequency of Social Gatherings with Friends and Family: Patient declined    Attends Religious Services: Patient declined    Active Member of Clubs or Organizations: Patient declined    Attends Banker Meetings: Patient declined    Marital Status: Patient declined  Catering manager Violence: Not on file     Current Outpatient Medications:    amitriptyline (ELAVIL) 25 MG tablet, Take 50 mg by mouth at bedtime., Disp: , Rfl:    cetirizine HCl (ZYRTEC) 5 MG/5ML SYRP, Take 5 mg by mouth daily., Disp: , Rfl:    drospirenone-ethinyl estradiol (YAZ) 3-0.02 MG tablet, Take 1 tablet by mouth daily., Disp: 28 tablet, Rfl: 0   magnesium citrate SOLN, Take by mouth., Disp: , Rfl:    ondansetron (ZOFRAN-ODT) 4 MG disintegrating tablet, Take 1 tablet (4 mg total) by mouth every 8 (eight) hours as needed for nausea or vomiting., Disp: 20 tablet, Rfl: 0   Pediatric Multiple Vit-C-FA (CHILDRENS CHEWABLE MULTI VITS PO), Take 1 tablet by mouth daily., Disp: , Rfl:    Riboflavin 100 MG CAPS, Take by mouth., Disp: , Rfl:    rizatriptan (MAXALT) 10 MG tablet, Take 10 mg by mouth 2 (two) times daily as needed., Disp: , Rfl:      ROS:  Review of Systems BREAST: No symptoms   Objective: LMP 07/23/2023 (Approximate)    OBGyn Exam  Results: No results found for this or any previous visit (from the past 24 hours).  Assessment/Plan: No diagnosis found.  No orders of the defined types were placed in this encounter.            GYN counsel {counseling: 16159}     F/U  No follow-ups on file.  Silus Lanzo B. Marietta Sikkema, PA-C 09/02/2023 4:33 PM

## 2023-09-03 ENCOUNTER — Ambulatory Visit: Payer: 59 | Admitting: Obstetrics and Gynecology

## 2023-09-03 DIAGNOSIS — Z3041 Encounter for surveillance of contraceptive pills: Secondary | ICD-10-CM

## 2023-09-03 DIAGNOSIS — Z01419 Encounter for gynecological examination (general) (routine) without abnormal findings: Secondary | ICD-10-CM

## 2023-09-03 DIAGNOSIS — Z113 Encounter for screening for infections with a predominantly sexual mode of transmission: Secondary | ICD-10-CM

## 2023-09-15 ENCOUNTER — Other Ambulatory Visit: Payer: Self-pay | Admitting: Obstetrics and Gynecology

## 2023-09-15 DIAGNOSIS — Z3041 Encounter for surveillance of contraceptive pills: Secondary | ICD-10-CM

## 2023-09-22 NOTE — Progress Notes (Unsigned)
 PCP:  Carren Rang, PA-C   No chief complaint on file.    HPI:      [name] is a 23 y.o. G0P0000 whose LMP was No LMP recorded., presents today for her annual examination.  Her menses are {norm/abn:715}, lasting {number: 22536} days.  Dysmenorrhea {dysmen:716}. She {does:18564} have intermenstrual bleeding.Would like to go back on OCPs for cycles and ovar cyst prevention. Was on sprintec and Loestrin in past for hx of menorrhagia with irregular cycles; stopped sprintec 3/23. Menses were monthly initially, lasting 7 days, mod flow, no BTB, no dysmen. Menses now closer to Q6 wks past several cycles, lasting 10 days, mod flow, no BTB, no dysmen. Hx of chronic migraines without aura. Wants low dose OCP.  Pt never sexually active.  Hx of RTO cyst 12/23, resolved on u/s 3/24. Pt had sever pain a few wks ago and thinks RTO cyst rupture.  Sex activity: single partner, contraception - OCP (estrogen/progesterone).  Last Pap: N/A due to age Hx of STDs: {STD hx:14358}  There is no FH of breast cancer. There is no FH of ovarian cancer. The patient {does:18564} do self-breast exams.  Tobacco use: {tob:20664} Alcohol use: {Alcohol:11675} No drug use.  Exercise: {exercise:31265}  She {does:18564} get adequate calcium and Vitamin D in her diet.  Patient Active Problem List   Diagnosis Date Noted   Right ovarian cyst 07/02/2022   Apophysitis of fifth metatarsal, left 06/13/2016   Strain of calf muscle, initial encounter 05/03/2016    Past Surgical History:  Procedure Laterality Date   HIP ARTHROSCOPY W/ LABRAL REPAIR     WISDOM TOOTH EXTRACTION      Family History  Problem Relation Age of Onset   Ovarian cysts Mother    Breast cancer Maternal Aunt        26s   Lung cancer Maternal Uncle    Prostate cancer Maternal Uncle    Diabetes Maternal Grandmother    Lung cancer Maternal Grandmother    Diabetes Maternal Grandfather    Stroke Paternal Grandmother    Brain cancer  Paternal Grandmother    Diabetes Paternal Grandfather    Breast cancer Maternal Great-grandmother        twice 70/80s    Social History   Socioeconomic History   Marital status: Single    Spouse name: Not on file   Number of children: Not on file   Years of education: Not on file   Highest education level: Not on file  Occupational History   Not on file  Tobacco Use   Smoking status: Never   Smokeless tobacco: Never  Vaping Use   Vaping status: Never Used  Substance and Sexual Activity   Alcohol use: No   Drug use: No   Sexual activity: Never    Birth control/protection: None  Other Topics Concern   Not on file  Social History Narrative   Not on file   Social Drivers of Health   Financial Resource Strain: Low Risk  (08/12/2023)   Received from [facility]   Overall Financial Resource Strain (CARDIA)    Difficulty of Paying Living Expenses: Not hard at all  Food Insecurity: No Food Insecurity (08/12/2023)   Received from [facility]   Hunger Vital Sign    Worried About Running Out of Food in the Last Year: Never true    Ran Out of Food in the Last Year: Never true  Transportation Needs: No Transportation Needs (08/12/2023)  Received from [facility] - Transportation    In the past 12 months, has lack of transportation kept you from medical appointments or from getting medications?: No    Lack of Transportation (Non-Medical): No  Physical Activity: Inactive (06/24/2018)   Received from [facility], [facility]   Exercise Vital Sign    Days of Exercise per Week: 0 days    Minutes of Exercise per Session: 0 min  Stress: No Stress Concern Present (06/24/2018)   Received from [facility], [facility]   [employer] of Occupational Health - Occupational Stress Questionnaire    Feeling of Stress : Not at all  Social Connections:  Unknown (06/24/2018)   Received from [facility], [facility]   Social Connection and Isolation Panel [NHANES]    Frequency of Communication with Friends and Family: Patient declined    Frequency of Social Gatherings with Friends and Family: Patient declined    Attends Religious Services: Patient declined    Active Member of Clubs or Organizations: Patient declined    Attends Banker Meetings: Patient declined    Marital Status: Patient declined  Catering manager Violence: Not on file     Current Outpatient Medications:    amitriptyline (ELAVIL) 25 MG tablet, Take 50 mg by mouth at bedtime., Disp: , Rfl:    cetirizine HCl (ZYRTEC) 5 MG/5ML SYRP, Take 5 mg by mouth daily., Disp: , Rfl:    magnesium citrate SOLN, Take by mouth., Disp: , Rfl:    NIKKI 3-0.02 MG tablet, TAKE 1 TABLET BY MOUTH EVERY DAY, Disp: 28 tablet, Rfl: 0   ondansetron (ZOFRAN-ODT) 4 MG disintegrating tablet, Take 1 tablet (4 mg total) by mouth every 8 (eight) hours as needed for nausea or vomiting., Disp: 20 tablet, Rfl: 0   Pediatric Multiple Vit-C-FA (CHILDRENS CHEWABLE MULTI VITS PO), Take 1 tablet by mouth daily., Disp: , Rfl:    Riboflavin 100 MG CAPS, Take by mouth., Disp: , Rfl:    rizatriptan (MAXALT) 10 MG tablet, Take 10 mg by mouth 2 (two) times daily as needed., Disp: , Rfl:      ROS:  Review of Systems BREAST: No symptoms   Objective: There were no vitals taken for this visit.   OBGyn Exam  Results: No results found for this or any previous visit (from the past 24 hours).  Assessment/Plan: No diagnosis found.  No orders of the defined types were placed in this encounter.            GYN counsel {counseling: 16159}     F/U  No follow-ups on file.  Yony Roulston B. Ariyah Sedlack, PA-C 09/22/2023 5:44 PM

## 2023-09-23 ENCOUNTER — Encounter: Payer: Self-pay | Admitting: Obstetrics and Gynecology

## 2023-09-23 ENCOUNTER — Ambulatory Visit (INDEPENDENT_AMBULATORY_CARE_PROVIDER_SITE_OTHER): Payer: 59 | Admitting: Obstetrics and Gynecology

## 2023-09-23 ENCOUNTER — Other Ambulatory Visit (HOSPITAL_COMMUNITY)
Admission: RE | Admit: 2023-09-23 | Discharge: 2023-09-23 | Disposition: A | Discharge status: Home / Self Care | Source: Ambulatory Visit | Attending: Obstetrics and Gynecology | Admitting: Obstetrics and Gynecology

## 2023-09-23 VITALS — BP 118/70 | Ht 67.0 in | Wt 148.0 lb

## 2023-09-23 DIAGNOSIS — Z113 Encounter for screening for infections with a predominantly sexual mode of transmission: Secondary | ICD-10-CM | POA: Diagnosis present

## 2023-09-23 DIAGNOSIS — Z01419 Encounter for gynecological examination (general) (routine) without abnormal findings: Secondary | ICD-10-CM | POA: Diagnosis not present

## 2023-09-23 DIAGNOSIS — Z3041 Encounter for surveillance of contraceptive pills: Secondary | ICD-10-CM

## 2023-09-23 MED ORDER — DROSPIRENONE-ETHINYL ESTRADIOL 3-0.02 MG PO TABS: 1.0000 | ORAL_TABLET | Freq: Every day | ORAL | 3 refills | Status: AC

## 2023-09-23 NOTE — Patient Instructions (Signed)
 I value your feedback and you entrusting [place] with your care. If you get a [place] patient survey, I would appreciate you taking the time to let us know about your experience today. Thank you! ? ? ?

## 2023-09-24 LAB — CERVICOVAGINAL ANCILLARY ONLY
Chlamydia: NEGATIVE
Comment: NEGATIVE
Comment: NORMAL
Neisseria Gonorrhea: NEGATIVE

## 2024-04-08 ENCOUNTER — Other Ambulatory Visit

## 2024-04-08 DIAGNOSIS — Z23 Encounter for immunization: Secondary | ICD-10-CM | POA: Diagnosis not present

## 2024-07-02 ENCOUNTER — Other Ambulatory Visit: Payer: Self-pay | Admitting: Student

## 2024-07-02 ENCOUNTER — Encounter: Payer: Self-pay | Admitting: Student

## 2024-07-02 DIAGNOSIS — M7989 Other specified soft tissue disorders: Secondary | ICD-10-CM

## 2024-07-07 ENCOUNTER — Ambulatory Visit
Admission: RE | Admit: 2024-07-07 | Discharge: 2024-07-07 | Disposition: A | Discharge status: Home / Self Care | Source: Ambulatory Visit | Attending: Student | Admitting: Student

## 2024-07-07 DIAGNOSIS — M7989 Other specified soft tissue disorders: Secondary | ICD-10-CM | POA: Diagnosis present

## 2024-08-05 ENCOUNTER — Ambulatory Visit

## 2024-08-05 ENCOUNTER — Other Ambulatory Visit: Payer: Self-pay

## 2024-08-05 VITALS — BP 116/82 | HR 97 | Temp 97.8°F | Wt 149.0 lb

## 2024-08-05 DIAGNOSIS — R6889 Other general symptoms and signs: Secondary | ICD-10-CM

## 2024-08-05 LAB — POC COVID19/FLU A&B COMBO
Covid Antigen, POC: NEGATIVE
Influenza A Antigen, POC: NEGATIVE
Influenza B Antigen, POC: NEGATIVE

## 2024-08-05 MED ORDER — OSELTAMIVIR PHOSPHATE 75 MG PO CAPS: 75.0000 mg | ORAL_CAPSULE | Freq: Two times a day (BID) | ORAL | 0 refills | Status: AC

## 2024-08-05 NOTE — Progress Notes (Signed)
 [facility] [address] [place], [place] [address] Phone: [phone] Fax: [phone]   Office Visit Note  Patient Name: Denise Sheppard  Date of Apmuy:[id number]  Med Rec number [id number]  Date of Service: 08/05/2024  Bee venom, Amoxicillin-pot clavulanate, Delsym [dextromethorphan polistirex er], Dextromethorphan, Dextromethorphan-guaifenesin, and Guaifenesin & derivatives  Chief Complaint  Patient presents with   Acute Visit     HPI Discussed the use of AI scribe software for clinical note transcription with the patient, who gave verbal consent to proceed.  History of Present Illness Denise Sheppard is a 24 year old female who presents with flu-like symptoms.  Upper respiratory symptoms - Onset yesterday with nasal congestion and scratchy throat progressing to sore throat this morning - Congestion has slightly improved but persists - Bilateral ear pain described as a clogged sensation - No cough, shortness of breath, or chest pain  Headache - Headache began yesterday, consistent with her typical migraines - Headache persists  Systemic symptoms - No body aches, chills, or fever  Exposure history - Recent close contact with mother and sister who were diagnosed with influenza - Concerned about possible influenza infection  Gynecologic history - Not pregnant, no concerns for pregnancy     24 y.o. female    Current Medication:  Outpatient Encounter Medications as of 08/05/2024  Medication Sig   amitriptyline (ELAVIL) 25 MG tablet Take 50 mg by mouth at bedtime.   cetirizine HCl (ZYRTEC) 5 MG/5ML SYRP Take 5 mg by mouth daily.   drospirenone -ethinyl estradiol  (NIKKI ) 3-0.02 MG tablet Take 1 tablet by mouth daily.   magnesium citrate SOLN Take by mouth.   Pediatric Multiple Vit-C-FA (CHILDRENS CHEWABLE MULTI VITS PO) Take 1 tablet by mouth daily.   Riboflavin 100 MG CAPS Take by mouth.   rizatriptan (MAXALT) 10 MG tablet Take 10 mg by mouth 2 (two)  times daily as needed.   Syringe/Needle, Disp, (SYRINGE 3CC/25GX1) 25G X 1 3 ML MISC Vit B12 1000 mcg IM once a week for 3 weeks then once a month for 4 months   No facility-administered encounter medications on file as of 08/05/2024.      Medical History: Past Medical History:  Diagnosis Date   Irregular periods    JIA (juvenile idiopathic arthritis) (HCC)    Migraine without aura    Ovarian cyst      Vital Signs: BP 116/82   Pulse 97   Temp 97.8 F (36.6 C) (Tympanic)   Wt 149 lb (67.6 kg)   SpO2 99%   BMI 23.34 kg/m    Review of Systems As per HPI  Physical Exam Constitutional:      Appearance: Normal appearance.  HENT:     Right Ear: Tympanic membrane, ear canal and external ear normal.     Left Ear: Tympanic membrane, ear canal and external ear normal.     Nose: Congestion present.     Mouth/Throat:     Mouth: Mucous membranes are moist.     Pharynx: Oropharynx is clear. No pharyngeal swelling, oropharyngeal exudate, posterior oropharyngeal erythema, uvula swelling or postnasal drip.     Tonsils: No tonsillar exudate.  Cardiovascular:     Rate and Rhythm: Normal rate and regular rhythm.  Pulmonary:     Effort: Pulmonary effort is normal.     Breath sounds: Normal breath sounds.  Musculoskeletal:     Cervical back: Normal range of motion and neck supple.  Neurological:     Mental Status: She is  alert.      Labs  Lab Orders         POC Covid19/Flu A&B Antigen      No results found for this or any previous visit (from the past 24 hours).   Medications Ordered    Assessment/Plan: Assessment and Plan Assessment & Plan Influenza-like illness Symptoms and exposure suggest influenza-like illness despite negative flu and COVID tests. No bacterial infection signs. Decision to treat with Tamiflu  due to early symptom onset and exposure history. - Prescribed Tamiflu  twice daily for five days, with food to minimize GI side effects. - Advised supportive  care: hydration, rest, and symptom management with DayQuil, NyQuil, or ibuprofen/Tylenol  rotation. - Recommended decongestants like Sudafed for nasal congestion - Advised wearing a mask and practicing hand hygiene if attending class or work. - Instructed to follow up in 5-7 days if symptoms worsen or do not improve, or if new symptoms occur   Return precautions discussed.   General Counseling: blythe veach understanding of the findings of todays visit and agrees with plan of treatment. I have discussed any further diagnostic evaluation that may be needed or ordered today. We also reviewed her medications today. she has been encouraged to call the office with any questions or concerns that should arise related to todays visit.    Verla Flemings, FNP-C Nurse Practitioner
